# Patient Record
Sex: Female | Born: 1935 | Race: White | Hispanic: No | Marital: Married | Smoking: Never smoker
Health system: Southern US, Community
[De-identification: ages and names within clinical notes are randomized; demographics above are authoritative.]

## PROBLEM LIST (undated history)

## (undated) DIAGNOSIS — E785 Hyperlipidemia, unspecified: Secondary | ICD-10-CM

## (undated) DIAGNOSIS — I10 Essential (primary) hypertension: Secondary | ICD-10-CM

## (undated) DIAGNOSIS — K219 Gastro-esophageal reflux disease without esophagitis: Secondary | ICD-10-CM

## (undated) HISTORY — DX: Hyperlipidemia, unspecified: E78.5

## (undated) HISTORY — DX: Essential (primary) hypertension: I10

---

## 1997-09-07 ENCOUNTER — Other Ambulatory Visit: Admission: RE | Admit: 1997-09-07 | Discharge: 1997-09-07 | Payer: Self-pay | Admitting: *Deleted

## 1998-10-06 ENCOUNTER — Other Ambulatory Visit: Admission: RE | Admit: 1998-10-06 | Discharge: 1998-10-06 | Payer: Self-pay | Admitting: *Deleted

## 1999-10-24 ENCOUNTER — Other Ambulatory Visit: Admission: RE | Admit: 1999-10-24 | Discharge: 1999-10-24 | Payer: Self-pay | Admitting: *Deleted

## 2000-09-27 ENCOUNTER — Other Ambulatory Visit: Admission: RE | Admit: 2000-09-27 | Discharge: 2000-09-27 | Payer: Self-pay | Admitting: *Deleted

## 2001-10-02 ENCOUNTER — Ambulatory Visit (HOSPITAL_COMMUNITY): Admission: RE | Admit: 2001-10-02 | Discharge: 2001-10-02 | Payer: Self-pay | Admitting: Family Medicine

## 2002-07-14 ENCOUNTER — Other Ambulatory Visit: Admission: RE | Admit: 2002-07-14 | Discharge: 2002-07-14 | Payer: Self-pay | Admitting: Gynecology

## 2003-12-01 ENCOUNTER — Encounter: Admission: RE | Admit: 2003-12-01 | Discharge: 2003-12-01 | Payer: Self-pay | Admitting: Family Medicine

## 2004-08-08 ENCOUNTER — Other Ambulatory Visit: Admission: RE | Admit: 2004-08-08 | Discharge: 2004-08-08 | Payer: Self-pay | Admitting: Gynecology

## 2004-08-21 IMAGING — US US ABDOMEN COMPLETE
1 series · 14 of 25 positions shown · non-contrast
Comparison: none

CLINICAL DATA: Abdomen pain, particularly right upper quadrant.
ULTRASOUND OF THE ABDOMEN 
Scans over the upper abdomen were performed.  The gallbladder is moderately well seen and no gallstones are noted.  The liver has a normal echogenic pattern.  The common bile duct measures 2.2 mm proximally but does measure 8.2 mm near the pancreatic head.  Correlation with liver function tests is recommended.  The IVC is grossly normal.  The pancreas appears normal.  Evaluate of the spleen is limited by bowel gas.  No hydronephrosis is seen.  The right kidney measures 11.1 cm sagittally with the left kidney measuring 11.2 cm.  The abdominal aorta is normal in caliber.
IMPRESSION
1.  Slightly prominent distal common bile duct near the pancreatic head.  No definite pancreatic head mass or stone is seen.  Correlate with LFTs.
2.  No hydronephrosis.

[Series 1: unknown · 0.27mm/px · 14 of 71 slices shown]
[im 1/71]
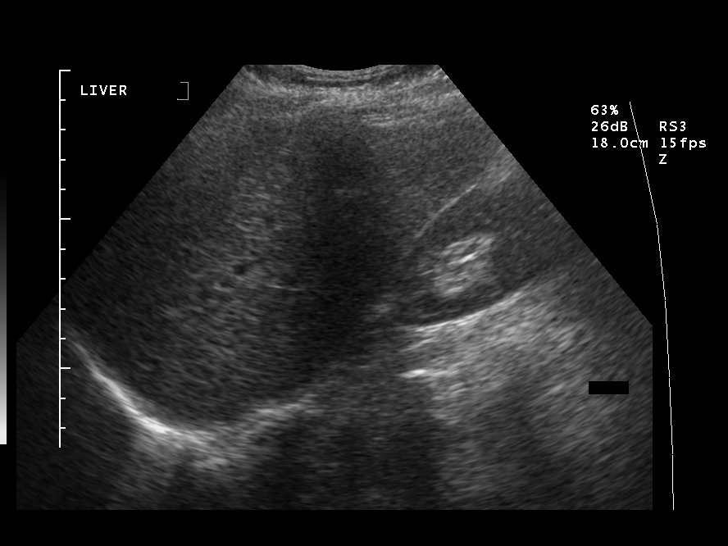
[im 6/71]
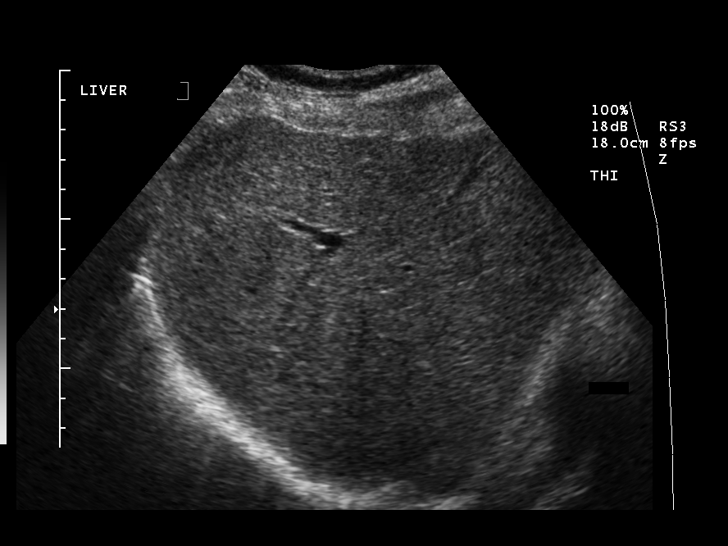
[im 12/71]
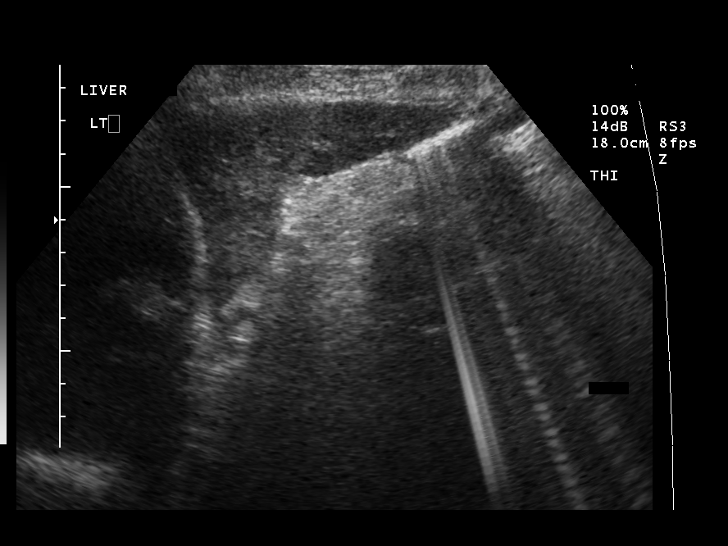
[im 18/71]
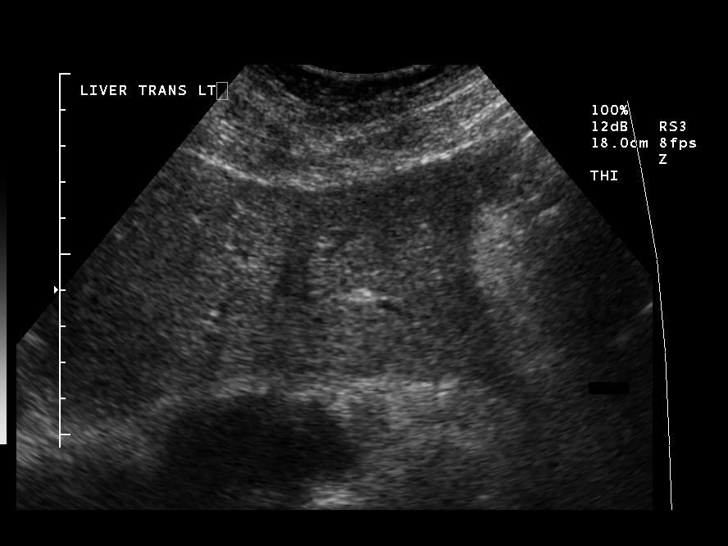
[im 24/71]
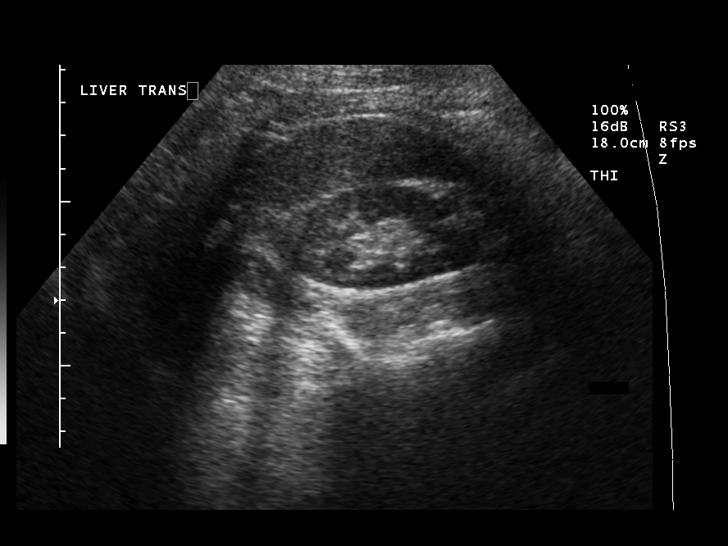
[im 27/71]
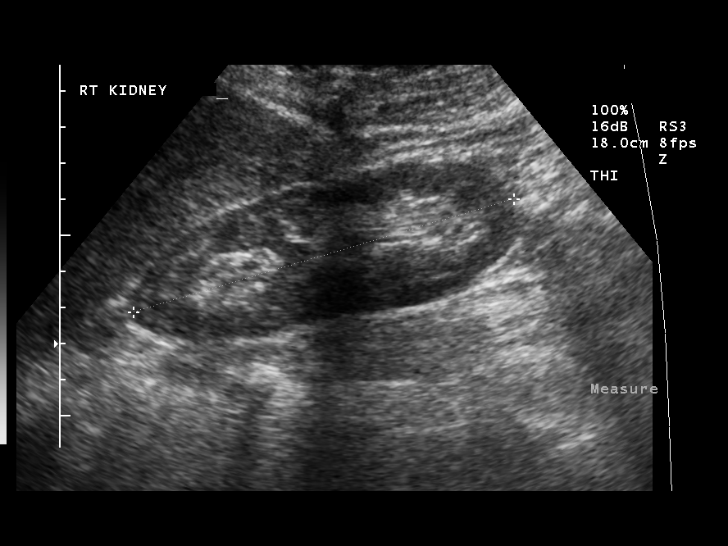
[im 33/71]
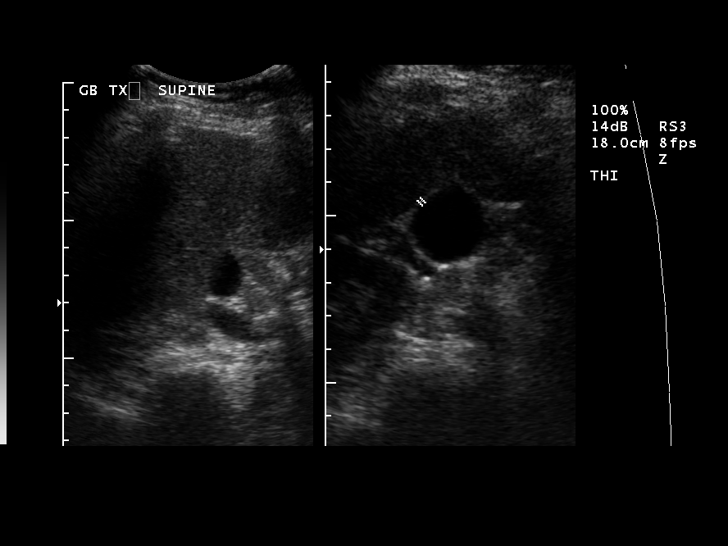
[im 38/71]
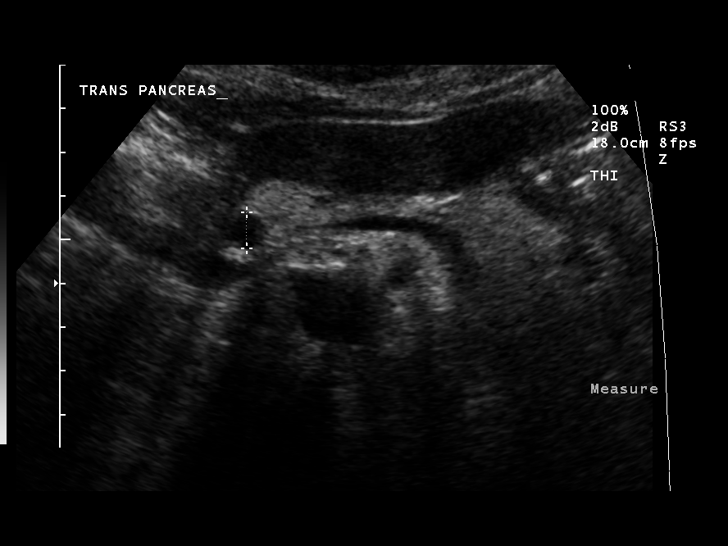
[im 44/71]
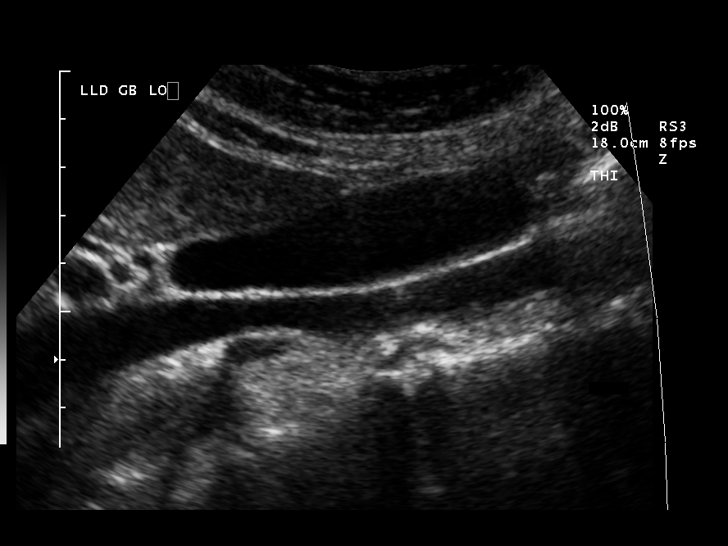
[im 47/71]
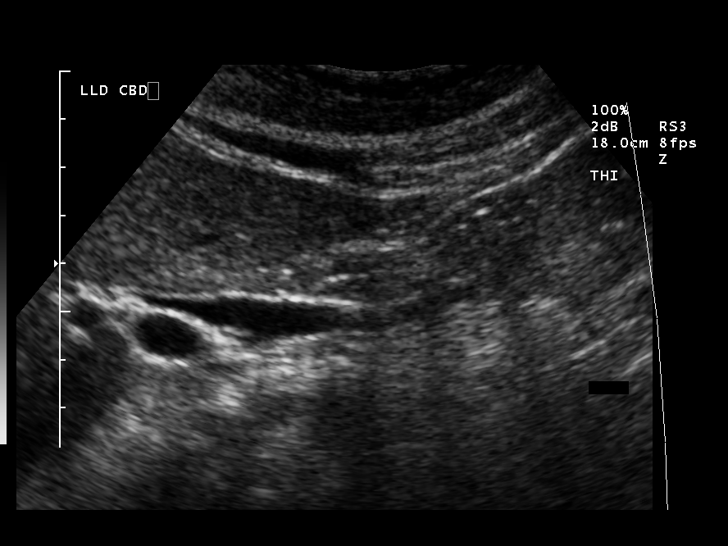
[im 53/71]
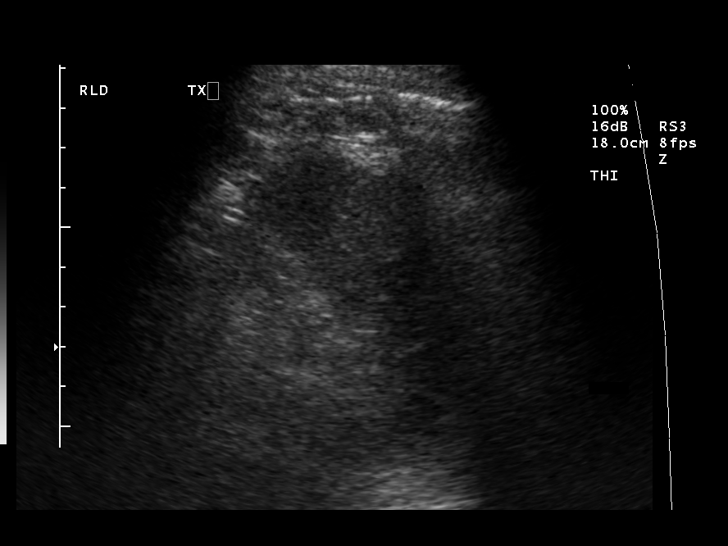
[im 59/71]
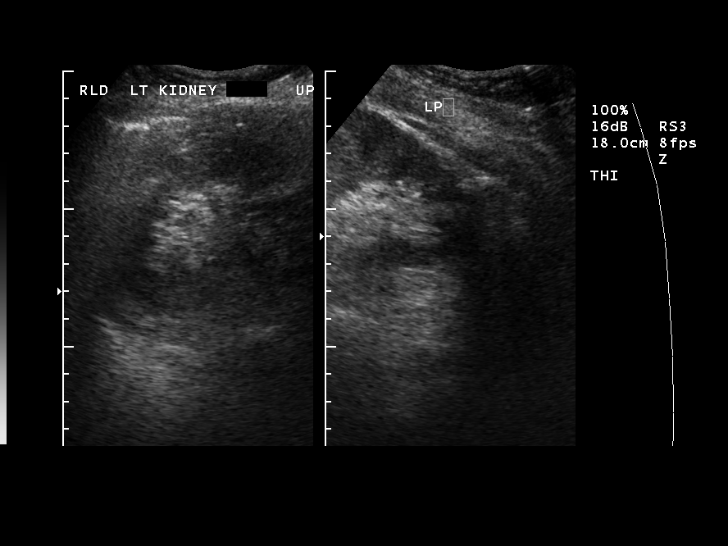
[im 65/71]
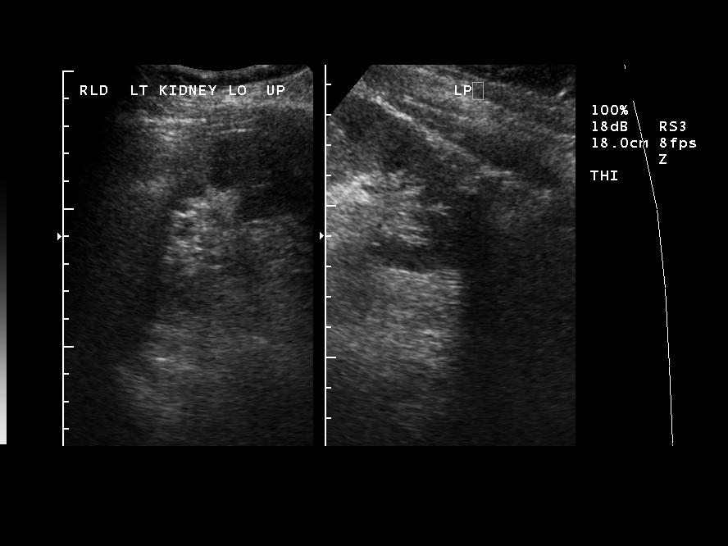
[im 71/71]
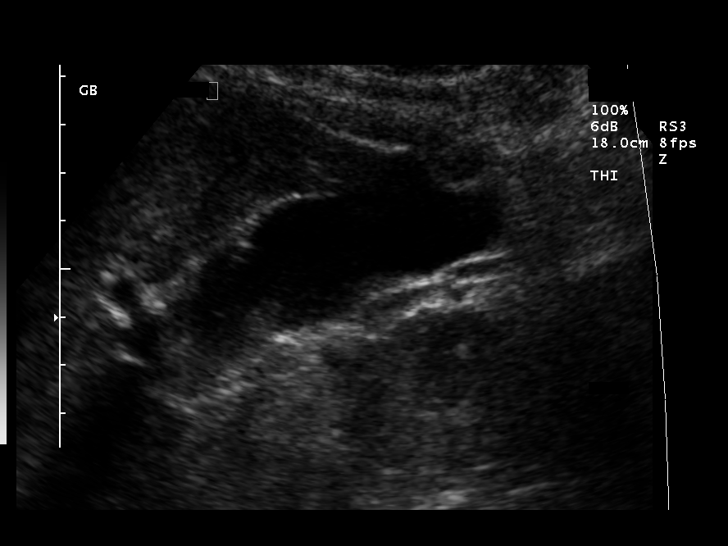

[14 of 25 positions shown; findings below may reference images not displayed]

## 2005-05-05 ENCOUNTER — Ambulatory Visit (HOSPITAL_COMMUNITY): Admission: RE | Admit: 2005-05-05 | Discharge: 2005-05-05 | Payer: Self-pay | Admitting: Family Medicine

## 2006-09-10 ENCOUNTER — Other Ambulatory Visit: Admission: RE | Admit: 2006-09-10 | Discharge: 2006-09-10 | Payer: Self-pay | Admitting: Gynecology

## 2007-03-04 ENCOUNTER — Other Ambulatory Visit: Admission: RE | Admit: 2007-03-04 | Discharge: 2007-03-04 | Payer: Self-pay | Admitting: Gynecology

## 2008-10-27 ENCOUNTER — Encounter (INDEPENDENT_AMBULATORY_CARE_PROVIDER_SITE_OTHER): Payer: Self-pay | Admitting: *Deleted

## 2010-11-27 HISTORY — PX: CATARACT EXTRACTION, BILATERAL: SHX1313

## 2011-03-21 ENCOUNTER — Encounter: Payer: Self-pay | Admitting: Internal Medicine

## 2011-04-24 ENCOUNTER — Ambulatory Visit (AMBULATORY_SURGERY_CENTER): Payer: Medicare Other | Admitting: *Deleted

## 2011-04-24 VITALS — Ht 63.5 in | Wt 137.2 lb

## 2011-04-24 DIAGNOSIS — Z1211 Encounter for screening for malignant neoplasm of colon: Secondary | ICD-10-CM

## 2011-04-24 MED ORDER — PEG-KCL-NACL-NASULF-NA ASC-C 100 G PO SOLR: ORAL | Status: DC

## 2011-05-05 ENCOUNTER — Ambulatory Visit (AMBULATORY_SURGERY_CENTER): Payer: Medicare Other | Admitting: Internal Medicine

## 2011-05-05 ENCOUNTER — Encounter: Payer: Self-pay | Admitting: Internal Medicine

## 2011-05-05 VITALS — BP 140/98 | HR 73 | Temp 98.5°F | Resp 15 | Ht 63.0 in | Wt 137.0 lb

## 2011-05-05 DIAGNOSIS — Z86010 Personal history of colonic polyps: Secondary | ICD-10-CM

## 2011-05-05 DIAGNOSIS — Z1211 Encounter for screening for malignant neoplasm of colon: Secondary | ICD-10-CM

## 2011-05-05 MED ORDER — SODIUM CHLORIDE 0.9 % IV SOLN: 500.0000 mL | INTRAVENOUS | Status: DC

## 2011-05-05 NOTE — Progress Notes (Signed)
Patient did not experience any of the following events: a burn prior to discharge; a fall within the facility; wrong site/side/patient/procedure/implant event; or a hospital transfer or hospital admission upon discharge from the facility. (G8907) Patient did not have preoperative order for IV antibiotic SSI prophylaxis. (G8918)  

## 2011-05-05 NOTE — Op Note (Signed)
[facility] [address] [place], [place]  [address]  COLONOSCOPY PROCEDURE REPORT  PATIENT:  Mckenzie, Warner  MR#:  [id number] BIRTHDATE:  1930/10/04, 80 yrs. old  GENDER:  female ENDOSCOPIST:  Hedwig Morton. Juanda Chance, MD REF. BY:  Theressa Millard, M.D. PROCEDURE DATE:  05/05/2011 PROCEDURE:  Colonoscopy 19147 ASA CLASS:  Class II INDICATIONS:  history of hyperplastic polyps hyperplastic polyp 2005 MEDICATIONS:   These medications were titrated to patient response per physician's verbal order, Versed 7 mg, Fentanyl 75 mcg  DESCRIPTION OF PROCEDURE:   After the risks and benefits and of the procedure were explained, informed consent was obtained. Digital rectal exam was performed and revealed no rectal masses. The LB [id number]-[id number] endoscope was introduced through the anus and advanced to the cecum, which was identified by both the appendix and ileocecal valve.  The quality of the prep was good, using MoviPrep.  The instrument was then slowly withdrawn as the colon was fully examined. <<PROCEDUREIMAGES>>  FINDINGS:  Moderate diverticulosis was found in the sigmoid colon (see image1, image7, image8, and image6).  This was otherwise a normal examination of the colon (see image9, image5, image4, image3, and image2).   Retroflexed views in the rectum revealed no abnormalities.    The scope was then withdrawn from the patient and the procedure completed.  COMPLICATIONS:  None ENDOSCOPIC IMPRESSION: 1) Moderate diverticulosis in the sigmoid colon 2) Otherwise normal examination RECOMMENDATIONS: 1) High fiber diet.  REPEAT EXAM:  In 0 year(s) for.  no recall due to age  ______________________________ Hedwig Morton. Juanda Chance, MD  CC:  n. eSIGNED:   Hedwig Morton. Brodie at 05/05/2011 11:12 AM  Kathlene November, [phone]

## 2011-05-08 ENCOUNTER — Telehealth: Payer: Self-pay | Admitting: *Deleted

## 2011-05-08 NOTE — Telephone Encounter (Signed)

## 2011-08-31 DIAGNOSIS — I10 Essential (primary) hypertension: Secondary | ICD-10-CM | POA: Diagnosis not present

## 2011-08-31 DIAGNOSIS — Z1331 Encounter for screening for depression: Secondary | ICD-10-CM | POA: Diagnosis not present

## 2011-08-31 DIAGNOSIS — E7800 Pure hypercholesterolemia, unspecified: Secondary | ICD-10-CM | POA: Diagnosis not present

## 2011-11-13 DIAGNOSIS — M949 Disorder of cartilage, unspecified: Secondary | ICD-10-CM | POA: Diagnosis not present

## 2011-11-13 DIAGNOSIS — G4700 Insomnia, unspecified: Secondary | ICD-10-CM | POA: Diagnosis not present

## 2011-11-13 DIAGNOSIS — M899 Disorder of bone, unspecified: Secondary | ICD-10-CM | POA: Diagnosis not present

## 2011-11-13 DIAGNOSIS — N8184 Pelvic muscle wasting: Secondary | ICD-10-CM | POA: Diagnosis not present

## 2011-11-22 DIAGNOSIS — L819 Disorder of pigmentation, unspecified: Secondary | ICD-10-CM | POA: Diagnosis not present

## 2011-11-22 DIAGNOSIS — B079 Viral wart, unspecified: Secondary | ICD-10-CM | POA: Diagnosis not present

## 2011-11-22 DIAGNOSIS — L821 Other seborrheic keratosis: Secondary | ICD-10-CM | POA: Diagnosis not present

## 2012-03-05 DIAGNOSIS — Z23 Encounter for immunization: Secondary | ICD-10-CM | POA: Diagnosis not present

## 2012-05-03 DIAGNOSIS — Z1231 Encounter for screening mammogram for malignant neoplasm of breast: Secondary | ICD-10-CM | POA: Diagnosis not present

## 2012-05-20 DIAGNOSIS — E7800 Pure hypercholesterolemia, unspecified: Secondary | ICD-10-CM | POA: Diagnosis not present

## 2012-05-20 DIAGNOSIS — I10 Essential (primary) hypertension: Secondary | ICD-10-CM | POA: Diagnosis not present

## 2012-08-22 DIAGNOSIS — L570 Actinic keratosis: Secondary | ICD-10-CM | POA: Diagnosis not present

## 2012-08-22 DIAGNOSIS — L821 Other seborrheic keratosis: Secondary | ICD-10-CM | POA: Diagnosis not present

## 2012-08-22 DIAGNOSIS — D485 Neoplasm of uncertain behavior of skin: Secondary | ICD-10-CM | POA: Diagnosis not present

## 2012-11-20 DIAGNOSIS — Z1331 Encounter for screening for depression: Secondary | ICD-10-CM | POA: Diagnosis not present

## 2012-11-20 DIAGNOSIS — I10 Essential (primary) hypertension: Secondary | ICD-10-CM | POA: Diagnosis not present

## 2012-11-20 DIAGNOSIS — G479 Sleep disorder, unspecified: Secondary | ICD-10-CM | POA: Diagnosis not present

## 2013-03-17 DIAGNOSIS — Z23 Encounter for immunization: Secondary | ICD-10-CM | POA: Diagnosis not present

## 2013-06-02 DIAGNOSIS — Z1231 Encounter for screening mammogram for malignant neoplasm of breast: Secondary | ICD-10-CM | POA: Diagnosis not present

## 2014-01-15 DIAGNOSIS — IMO0002 Reserved for concepts with insufficient information to code with codable children: Secondary | ICD-10-CM | POA: Diagnosis not present

## 2014-01-15 DIAGNOSIS — E7800 Pure hypercholesterolemia, unspecified: Secondary | ICD-10-CM | POA: Diagnosis not present

## 2014-01-15 DIAGNOSIS — Z1331 Encounter for screening for depression: Secondary | ICD-10-CM | POA: Diagnosis not present

## 2014-01-15 DIAGNOSIS — I10 Essential (primary) hypertension: Secondary | ICD-10-CM | POA: Diagnosis not present

## 2014-01-15 DIAGNOSIS — Z23 Encounter for immunization: Secondary | ICD-10-CM | POA: Diagnosis not present

## 2014-01-19 DIAGNOSIS — I10 Essential (primary) hypertension: Secondary | ICD-10-CM | POA: Diagnosis not present

## 2014-01-19 DIAGNOSIS — E7800 Pure hypercholesterolemia, unspecified: Secondary | ICD-10-CM | POA: Diagnosis not present

## 2014-02-13 DIAGNOSIS — Z23 Encounter for immunization: Secondary | ICD-10-CM | POA: Diagnosis not present

## 2014-07-06 DIAGNOSIS — Z1231 Encounter for screening mammogram for malignant neoplasm of breast: Secondary | ICD-10-CM | POA: Diagnosis not present

## 2014-10-13 ENCOUNTER — Encounter: Payer: Self-pay | Admitting: Internal Medicine

## 2015-02-08 DIAGNOSIS — Z1389 Encounter for screening for other disorder: Secondary | ICD-10-CM | POA: Diagnosis not present

## 2015-02-08 DIAGNOSIS — E780 Pure hypercholesterolemia: Secondary | ICD-10-CM | POA: Diagnosis not present

## 2015-02-08 DIAGNOSIS — I10 Essential (primary) hypertension: Secondary | ICD-10-CM | POA: Diagnosis not present

## 2015-02-08 DIAGNOSIS — M79604 Pain in right leg: Secondary | ICD-10-CM | POA: Diagnosis not present

## 2015-02-08 DIAGNOSIS — Z0001 Encounter for general adult medical examination with abnormal findings: Secondary | ICD-10-CM | POA: Diagnosis not present

## 2015-03-22 DIAGNOSIS — J040 Acute laryngitis: Secondary | ICD-10-CM | POA: Diagnosis not present

## 2015-03-22 DIAGNOSIS — J029 Acute pharyngitis, unspecified: Secondary | ICD-10-CM | POA: Diagnosis not present

## 2015-03-22 DIAGNOSIS — R05 Cough: Secondary | ICD-10-CM | POA: Diagnosis not present

## 2015-07-13 DIAGNOSIS — Z961 Presence of intraocular lens: Secondary | ICD-10-CM | POA: Diagnosis not present

## 2015-07-13 DIAGNOSIS — H26491 Other secondary cataract, right eye: Secondary | ICD-10-CM | POA: Diagnosis not present

## 2015-07-13 DIAGNOSIS — H26492 Other secondary cataract, left eye: Secondary | ICD-10-CM | POA: Diagnosis not present

## 2015-08-16 DIAGNOSIS — Z1231 Encounter for screening mammogram for malignant neoplasm of breast: Secondary | ICD-10-CM | POA: Diagnosis not present

## 2016-01-25 DIAGNOSIS — H02839 Dermatochalasis of unspecified eye, unspecified eyelid: Secondary | ICD-10-CM | POA: Diagnosis not present

## 2016-01-25 DIAGNOSIS — Z961 Presence of intraocular lens: Secondary | ICD-10-CM | POA: Diagnosis not present

## 2016-01-25 DIAGNOSIS — H26492 Other secondary cataract, left eye: Secondary | ICD-10-CM | POA: Diagnosis not present

## 2016-02-11 DIAGNOSIS — E7800 Pure hypercholesterolemia, unspecified: Secondary | ICD-10-CM | POA: Diagnosis not present

## 2016-02-11 DIAGNOSIS — I10 Essential (primary) hypertension: Secondary | ICD-10-CM | POA: Diagnosis not present

## 2016-02-11 DIAGNOSIS — Z1389 Encounter for screening for other disorder: Secondary | ICD-10-CM | POA: Diagnosis not present

## 2016-02-11 DIAGNOSIS — R7309 Other abnormal glucose: Secondary | ICD-10-CM | POA: Diagnosis not present

## 2016-02-11 DIAGNOSIS — M8580 Other specified disorders of bone density and structure, unspecified site: Secondary | ICD-10-CM | POA: Diagnosis not present

## 2016-02-11 DIAGNOSIS — Z0000 Encounter for general adult medical examination without abnormal findings: Secondary | ICD-10-CM | POA: Diagnosis not present

## 2016-02-11 DIAGNOSIS — Z23 Encounter for immunization: Secondary | ICD-10-CM | POA: Diagnosis not present

## 2016-02-11 DIAGNOSIS — N952 Postmenopausal atrophic vaginitis: Secondary | ICD-10-CM | POA: Diagnosis not present

## 2016-03-13 DIAGNOSIS — Z780 Asymptomatic menopausal state: Secondary | ICD-10-CM | POA: Diagnosis not present

## 2016-03-13 DIAGNOSIS — E7800 Pure hypercholesterolemia, unspecified: Secondary | ICD-10-CM | POA: Diagnosis not present

## 2016-03-13 DIAGNOSIS — N952 Postmenopausal atrophic vaginitis: Secondary | ICD-10-CM | POA: Diagnosis not present

## 2016-03-13 DIAGNOSIS — I10 Essential (primary) hypertension: Secondary | ICD-10-CM | POA: Diagnosis not present

## 2016-03-13 DIAGNOSIS — M810 Age-related osteoporosis without current pathological fracture: Secondary | ICD-10-CM | POA: Diagnosis not present

## 2016-07-31 DIAGNOSIS — I10 Essential (primary) hypertension: Secondary | ICD-10-CM | POA: Diagnosis not present

## 2016-07-31 DIAGNOSIS — E7800 Pure hypercholesterolemia, unspecified: Secondary | ICD-10-CM | POA: Diagnosis not present

## 2016-07-31 DIAGNOSIS — M810 Age-related osteoporosis without current pathological fracture: Secondary | ICD-10-CM | POA: Diagnosis not present

## 2016-07-31 DIAGNOSIS — N952 Postmenopausal atrophic vaginitis: Secondary | ICD-10-CM | POA: Diagnosis not present

## 2016-07-31 DIAGNOSIS — G4700 Insomnia, unspecified: Secondary | ICD-10-CM | POA: Diagnosis not present

## 2017-01-01 DIAGNOSIS — H26492 Other secondary cataract, left eye: Secondary | ICD-10-CM | POA: Diagnosis not present

## 2017-01-01 DIAGNOSIS — H02839 Dermatochalasis of unspecified eye, unspecified eyelid: Secondary | ICD-10-CM | POA: Diagnosis not present

## 2017-01-01 DIAGNOSIS — Z961 Presence of intraocular lens: Secondary | ICD-10-CM | POA: Diagnosis not present

## 2017-01-01 DIAGNOSIS — H04123 Dry eye syndrome of bilateral lacrimal glands: Secondary | ICD-10-CM | POA: Diagnosis not present

## 2017-02-09 DIAGNOSIS — Z23 Encounter for immunization: Secondary | ICD-10-CM | POA: Diagnosis not present

## 2017-02-14 DIAGNOSIS — N952 Postmenopausal atrophic vaginitis: Secondary | ICD-10-CM | POA: Diagnosis not present

## 2017-02-14 DIAGNOSIS — Z1389 Encounter for screening for other disorder: Secondary | ICD-10-CM | POA: Diagnosis not present

## 2017-02-14 DIAGNOSIS — M810 Age-related osteoporosis without current pathological fracture: Secondary | ICD-10-CM | POA: Diagnosis not present

## 2017-02-14 DIAGNOSIS — I10 Essential (primary) hypertension: Secondary | ICD-10-CM | POA: Diagnosis not present

## 2017-02-14 DIAGNOSIS — E7800 Pure hypercholesterolemia, unspecified: Secondary | ICD-10-CM | POA: Diagnosis not present

## 2017-02-14 DIAGNOSIS — Z0000 Encounter for general adult medical examination without abnormal findings: Secondary | ICD-10-CM | POA: Diagnosis not present

## 2017-02-14 DIAGNOSIS — R7303 Prediabetes: Secondary | ICD-10-CM | POA: Diagnosis not present

## 2017-02-14 DIAGNOSIS — G4700 Insomnia, unspecified: Secondary | ICD-10-CM | POA: Diagnosis not present

## 2017-08-20 DIAGNOSIS — E7800 Pure hypercholesterolemia, unspecified: Secondary | ICD-10-CM | POA: Diagnosis not present

## 2017-08-20 DIAGNOSIS — I10 Essential (primary) hypertension: Secondary | ICD-10-CM | POA: Diagnosis not present

## 2017-08-20 DIAGNOSIS — N8110 Cystocele, unspecified: Secondary | ICD-10-CM | POA: Diagnosis not present

## 2017-08-20 DIAGNOSIS — M810 Age-related osteoporosis without current pathological fracture: Secondary | ICD-10-CM | POA: Diagnosis not present

## 2017-08-27 DIAGNOSIS — N763 Subacute and chronic vulvitis: Secondary | ICD-10-CM | POA: Diagnosis not present

## 2017-09-25 DIAGNOSIS — N952 Postmenopausal atrophic vaginitis: Secondary | ICD-10-CM | POA: Diagnosis not present

## 2017-09-25 DIAGNOSIS — N763 Subacute and chronic vulvitis: Secondary | ICD-10-CM | POA: Diagnosis not present

## 2018-01-07 DIAGNOSIS — H02886 Meibomian gland dysfunction of left eye, unspecified eyelid: Secondary | ICD-10-CM | POA: Diagnosis not present

## 2018-01-07 DIAGNOSIS — H26492 Other secondary cataract, left eye: Secondary | ICD-10-CM | POA: Diagnosis not present

## 2018-01-07 DIAGNOSIS — H02883 Meibomian gland dysfunction of right eye, unspecified eyelid: Secondary | ICD-10-CM | POA: Diagnosis not present

## 2018-02-25 DIAGNOSIS — Z1389 Encounter for screening for other disorder: Secondary | ICD-10-CM | POA: Diagnosis not present

## 2018-02-25 DIAGNOSIS — I10 Essential (primary) hypertension: Secondary | ICD-10-CM | POA: Diagnosis not present

## 2018-02-25 DIAGNOSIS — M810 Age-related osteoporosis without current pathological fracture: Secondary | ICD-10-CM | POA: Diagnosis not present

## 2018-02-25 DIAGNOSIS — E7800 Pure hypercholesterolemia, unspecified: Secondary | ICD-10-CM | POA: Diagnosis not present

## 2018-02-25 DIAGNOSIS — Z0000 Encounter for general adult medical examination without abnormal findings: Secondary | ICD-10-CM | POA: Diagnosis not present

## 2018-02-25 DIAGNOSIS — N952 Postmenopausal atrophic vaginitis: Secondary | ICD-10-CM | POA: Diagnosis not present

## 2018-02-25 DIAGNOSIS — Z23 Encounter for immunization: Secondary | ICD-10-CM | POA: Diagnosis not present

## 2018-02-25 DIAGNOSIS — R300 Dysuria: Secondary | ICD-10-CM | POA: Diagnosis not present

## 2018-02-28 DIAGNOSIS — N763 Subacute and chronic vulvitis: Secondary | ICD-10-CM | POA: Diagnosis not present

## 2018-02-28 DIAGNOSIS — N711 Chronic inflammatory disease of uterus: Secondary | ICD-10-CM | POA: Diagnosis not present

## 2018-02-28 DIAGNOSIS — N952 Postmenopausal atrophic vaginitis: Secondary | ICD-10-CM | POA: Diagnosis not present

## 2018-03-28 DIAGNOSIS — N952 Postmenopausal atrophic vaginitis: Secondary | ICD-10-CM | POA: Diagnosis not present

## 2018-03-28 DIAGNOSIS — N763 Subacute and chronic vulvitis: Secondary | ICD-10-CM | POA: Diagnosis not present

## 2018-07-29 DIAGNOSIS — G5602 Carpal tunnel syndrome, left upper limb: Secondary | ICD-10-CM | POA: Diagnosis not present

## 2018-07-31 DIAGNOSIS — G5602 Carpal tunnel syndrome, left upper limb: Secondary | ICD-10-CM | POA: Diagnosis not present

## 2018-07-31 DIAGNOSIS — G5612 Other lesions of median nerve, left upper limb: Secondary | ICD-10-CM | POA: Diagnosis not present

## 2018-08-01 ENCOUNTER — Other Ambulatory Visit: Payer: Self-pay

## 2018-08-01 ENCOUNTER — Encounter (HOSPITAL_BASED_OUTPATIENT_CLINIC_OR_DEPARTMENT_OTHER): Payer: Self-pay

## 2018-08-01 ENCOUNTER — Other Ambulatory Visit: Payer: Self-pay | Admitting: Orthopedic Surgery

## 2018-08-05 ENCOUNTER — Encounter (HOSPITAL_BASED_OUTPATIENT_CLINIC_OR_DEPARTMENT_OTHER)
Admission: RE | Admit: 2018-08-05 | Discharge: 2018-08-05 | Disposition: A | Discharge status: Home / Self Care | Payer: Medicare Other | Source: Ambulatory Visit | Attending: Orthopedic Surgery | Admitting: Orthopedic Surgery

## 2018-08-05 DIAGNOSIS — Z79899 Other long term (current) drug therapy: Secondary | ICD-10-CM | POA: Diagnosis not present

## 2018-08-05 DIAGNOSIS — K219 Gastro-esophageal reflux disease without esophagitis: Secondary | ICD-10-CM | POA: Diagnosis not present

## 2018-08-05 DIAGNOSIS — Z9841 Cataract extraction status, right eye: Secondary | ICD-10-CM | POA: Diagnosis not present

## 2018-08-05 DIAGNOSIS — E785 Hyperlipidemia, unspecified: Secondary | ICD-10-CM | POA: Diagnosis not present

## 2018-08-05 DIAGNOSIS — G5602 Carpal tunnel syndrome, left upper limb: Secondary | ICD-10-CM | POA: Diagnosis not present

## 2018-08-05 DIAGNOSIS — Z9842 Cataract extraction status, left eye: Secondary | ICD-10-CM | POA: Diagnosis not present

## 2018-08-05 DIAGNOSIS — I10 Essential (primary) hypertension: Secondary | ICD-10-CM | POA: Diagnosis not present

## 2018-08-08 ENCOUNTER — Encounter (HOSPITAL_BASED_OUTPATIENT_CLINIC_OR_DEPARTMENT_OTHER): Payer: Self-pay | Admitting: *Deleted

## 2018-08-08 ENCOUNTER — Ambulatory Visit (HOSPITAL_BASED_OUTPATIENT_CLINIC_OR_DEPARTMENT_OTHER): Payer: Medicare Other | Admitting: Anesthesiology

## 2018-08-08 ENCOUNTER — Other Ambulatory Visit: Payer: Self-pay

## 2018-08-08 ENCOUNTER — Ambulatory Visit (HOSPITAL_BASED_OUTPATIENT_CLINIC_OR_DEPARTMENT_OTHER)
Admission: RE | Admit: 2018-08-08 | Discharge: 2018-08-08 | Disposition: A | Discharge status: Home / Self Care | Payer: Medicare Other | Attending: Orthopedic Surgery | Admitting: Orthopedic Surgery

## 2018-08-08 ENCOUNTER — Encounter (HOSPITAL_BASED_OUTPATIENT_CLINIC_OR_DEPARTMENT_OTHER)
Admission: RE | Disposition: A | Discharge status: Home / Self Care | Payer: Self-pay | Source: Home / Self Care | Attending: Orthopedic Surgery

## 2018-08-08 DIAGNOSIS — E785 Hyperlipidemia, unspecified: Secondary | ICD-10-CM | POA: Insufficient documentation

## 2018-08-08 DIAGNOSIS — K219 Gastro-esophageal reflux disease without esophagitis: Secondary | ICD-10-CM | POA: Insufficient documentation

## 2018-08-08 DIAGNOSIS — G5602 Carpal tunnel syndrome, left upper limb: Secondary | ICD-10-CM | POA: Insufficient documentation

## 2018-08-08 DIAGNOSIS — I10 Essential (primary) hypertension: Secondary | ICD-10-CM | POA: Diagnosis not present

## 2018-08-08 DIAGNOSIS — Z9842 Cataract extraction status, left eye: Secondary | ICD-10-CM | POA: Insufficient documentation

## 2018-08-08 DIAGNOSIS — Z79899 Other long term (current) drug therapy: Secondary | ICD-10-CM | POA: Insufficient documentation

## 2018-08-08 DIAGNOSIS — Z9841 Cataract extraction status, right eye: Secondary | ICD-10-CM | POA: Insufficient documentation

## 2018-08-08 HISTORY — PX: CARPAL TUNNEL RELEASE: SHX101

## 2018-08-08 HISTORY — DX: Gastro-esophageal reflux disease without esophagitis: K21.9

## 2018-08-08 SURGERY — CARPAL TUNNEL RELEASE
Anesthesia: Regional | Laterality: Left

## 2018-08-08 MED ORDER — CEFAZOLIN SODIUM-DEXTROSE 2-4 GM/100ML-% IV SOLN
INTRAVENOUS | Status: AC
  Filled 2018-08-08: qty 100

## 2018-08-08 MED ORDER — ONDANSETRON HCL 4 MG/2ML IJ SOLN
INTRAMUSCULAR | Status: AC
  Filled 2018-08-08: qty 2

## 2018-08-08 MED ORDER — FENTANYL CITRATE (PF) 100 MCG/2ML IJ SOLN
50.0000 ug | INTRAMUSCULAR | Status: DC | PRN
  Administered 2018-08-08 (×2): 50 ug via INTRAVENOUS

## 2018-08-08 MED ORDER — FENTANYL CITRATE (PF) 100 MCG/2ML IJ SOLN
INTRAMUSCULAR | Status: AC
  Filled 2018-08-08: qty 2

## 2018-08-08 MED ORDER — OXYCODONE HCL 5 MG PO TABS: 5.0000 mg | ORAL_TABLET | Freq: Once | ORAL | Status: DC | PRN

## 2018-08-08 MED ORDER — ONDANSETRON HCL 4 MG/2ML IJ SOLN
INTRAMUSCULAR | Status: DC | PRN
  Administered 2018-08-08: 4 mg via INTRAVENOUS

## 2018-08-08 MED ORDER — FENTANYL CITRATE (PF) 100 MCG/2ML IJ SOLN: 25.0000 ug | INTRAMUSCULAR | Status: DC | PRN

## 2018-08-08 MED ORDER — CHLORHEXIDINE GLUCONATE 4 % EX LIQD: 60.0000 mL | Freq: Once | CUTANEOUS | Status: DC

## 2018-08-08 MED ORDER — LACTATED RINGERS IV SOLN
INTRAVENOUS | Status: DC
  Administered 2018-08-08: 09:00:00 via INTRAVENOUS

## 2018-08-08 MED ORDER — CEFAZOLIN SODIUM-DEXTROSE 2-4 GM/100ML-% IV SOLN
2.0000 g | INTRAVENOUS | Status: AC
  Administered 2018-08-08: 2 g via INTRAVENOUS

## 2018-08-08 MED ORDER — OXYCODONE HCL 5 MG/5ML PO SOLN: 5.0000 mg | Freq: Once | ORAL | Status: DC | PRN

## 2018-08-08 MED ORDER — BUPIVACAINE HCL (PF) 0.25 % IJ SOLN
INTRAMUSCULAR | Status: DC | PRN
  Administered 2018-08-08: 10 mL

## 2018-08-08 MED ORDER — ONDANSETRON HCL 4 MG/2ML IJ SOLN: 4.0000 mg | Freq: Once | INTRAMUSCULAR | Status: DC | PRN

## 2018-08-08 MED ORDER — LIDOCAINE HCL (CARDIAC) PF 100 MG/5ML IV SOSY
PREFILLED_SYRINGE | INTRAVENOUS | Status: DC | PRN
  Administered 2018-08-08: 20 mg via INTRAVENOUS

## 2018-08-08 MED ORDER — SCOPOLAMINE 1 MG/3DAYS TD PT72: 1.0000 | MEDICATED_PATCH | Freq: Once | TRANSDERMAL | Status: DC | PRN

## 2018-08-08 MED ORDER — PROPOFOL 10 MG/ML IV BOLUS
INTRAVENOUS | Status: DC | PRN
  Administered 2018-08-08: 20 mg via INTRAVENOUS

## 2018-08-08 MED ORDER — MIDAZOLAM HCL 2 MG/2ML IJ SOLN: 1.0000 mg | INTRAMUSCULAR | Status: DC | PRN

## 2018-08-08 MED ORDER — LIDOCAINE HCL (PF) 0.5 % IJ SOLN
INTRAMUSCULAR | Status: DC | PRN
  Administered 2018-08-08: 35 mL via INTRAVENOUS

## 2018-08-08 MED ORDER — HYDROCODONE-ACETAMINOPHEN 5-325 MG PO TABS: ORAL_TABLET | ORAL | 0 refills | Status: AC

## 2018-08-08 MED ORDER — LIDOCAINE 2% (20 MG/ML) 5 ML SYRINGE
INTRAMUSCULAR | Status: AC
  Filled 2018-08-08: qty 5

## 2018-08-08 SURGICAL SUPPLY — 41 items
BANDAGE ACE 3X5.8 VEL STRL LF (GAUZE/BANDAGES/DRESSINGS) ×3 IMPLANT
BLADE SURG 15 STRL LF DISP TIS (BLADE) ×2 IMPLANT
BLADE SURG 15 STRL SS (BLADE) ×6
BNDG CMPR 9X4 STRL LF SNTH (GAUZE/BANDAGES/DRESSINGS) ×1
BNDG COHESIVE 1X5 TAN NS LF (GAUZE/BANDAGES/DRESSINGS) ×3 IMPLANT
BNDG ESMARK 4X9 LF (GAUZE/BANDAGES/DRESSINGS) ×3 IMPLANT
BNDG GAUZE ELAST 4 BULKY (GAUZE/BANDAGES/DRESSINGS) ×3 IMPLANT
CHLORAPREP W/TINT 26 (MISCELLANEOUS) ×3 IMPLANT
CORD BIPOLAR FORCEPS 12FT (ELECTRODE) ×3 IMPLANT
COVER BACK TABLE 60X90IN (DRAPES) ×3 IMPLANT
COVER MAYO STAND STRL (DRAPES) ×3 IMPLANT
COVER WAND RF STERILE (DRAPES) IMPLANT
CUFF TOURN SGL QUICK 18X4 (TOURNIQUET CUFF) ×3 IMPLANT
CUFF TOURNIQUET SINGLE 18IN (TOURNIQUET CUFF) ×3 IMPLANT
DRAPE EXTREMITY T 121X128X90 (DISPOSABLE) ×3 IMPLANT
DRAPE SURG 17X23 STRL (DRAPES) ×3 IMPLANT
DRSG PAD ABDOMINAL 8X10 ST (GAUZE/BANDAGES/DRESSINGS) ×3 IMPLANT
GAUZE SPONGE 4X4 12PLY STRL (GAUZE/BANDAGES/DRESSINGS) ×3 IMPLANT
GAUZE XEROFORM 1X8 LF (GAUZE/BANDAGES/DRESSINGS) ×3 IMPLANT
GLOVE BIO SURGEON STRL SZ7.5 (GLOVE) ×3 IMPLANT
GLOVE BIOGEL PI IND STRL 7.5 (GLOVE) ×1 IMPLANT
GLOVE BIOGEL PI IND STRL 8 (GLOVE) ×1 IMPLANT
GLOVE BIOGEL PI INDICATOR 7.5 (GLOVE) ×2
GLOVE BIOGEL PI INDICATOR 8 (GLOVE) ×2
GLOVE SS BIOGEL STRL SZ 7 (GLOVE) ×1 IMPLANT
GLOVE SUPERSENSE BIOGEL SZ 7 (GLOVE) ×2
GLOVE SURG SS PI 7.5 STRL IVOR (GLOVE) ×3 IMPLANT
GOWN STRL REUS W/ TWL LRG LVL3 (GOWN DISPOSABLE) ×1 IMPLANT
GOWN STRL REUS W/TWL LRG LVL3 (GOWN DISPOSABLE) ×2
GOWN STRL REUS W/TWL XL LVL3 (GOWN DISPOSABLE) ×3 IMPLANT
NEEDLE HYPO 25X1 1.5 SAFETY (NEEDLE) ×3 IMPLANT
NS IRRIG 1000ML POUR BTL (IV SOLUTION) ×3 IMPLANT
PACK BASIN DAY SURGERY FS (CUSTOM PROCEDURE TRAY) ×3 IMPLANT
PADDING CAST ABS 4INX4YD NS (CAST SUPPLIES) ×2
PADDING CAST ABS COTTON 4X4 ST (CAST SUPPLIES) ×1 IMPLANT
STOCKINETTE 4X48 STRL (DRAPES) ×3 IMPLANT
SUT ETHILON 4 0 PS 2 18 (SUTURE) ×3 IMPLANT
SYR BULB 3OZ (MISCELLANEOUS) ×3 IMPLANT
SYR CONTROL 10ML LL (SYRINGE) ×3 IMPLANT
TOWEL GREEN STERILE FF (TOWEL DISPOSABLE) ×6 IMPLANT
UNDERPAD 30X30 (UNDERPADS AND DIAPERS) ×3 IMPLANT

## 2018-08-08 NOTE — Anesthesia Procedure Notes (Signed)
Anesthesia Regional Block: Bier block (IV Regional)   Pre-Anesthetic Checklist: ,, timeout performed, Correct Patient, Correct Site, Correct Laterality, Correct Procedure,, site marked, surgical consent,, at surgeon's request  Laterality: Left     Needles:  Injection technique: Single-shot  Needle Type: Other      Needle Gauge: 20     Additional Needles:   Procedures:,,,,, intact distal pulses, Esmarch exsanguination, single tourniquet utilized,  Narrative:   Performed by: Personally       

## 2018-08-08 NOTE — H&P (Signed)
  Mckenzie Warner is an 85 y.o. female.   Chief Complaint: left carpal tunnel syndrome HPI: 85 yo female with numbness and tingling left hand.  Positive nerve conduction studies. Nocturnal symptoms.  She wishes to have a carpal tunnel release.  Allergies: No Known Allergies  Past Medical History:  Diagnosis Date  . GERD (gastroesophageal reflux disease)    tums as needed  . Hyperlipidemia   . Hypertension     Past Surgical History:  Procedure Laterality Date  . CATARACT EXTRACTION, BILATERAL  11/2010    Family History: Family History  Problem Relation Age of Onset  . Colon cancer Neg Hx   . Esophageal cancer Neg Hx     Social History:   reports that she has never smoked. She has never used smokeless tobacco. She reports current alcohol use. She reports that she does not use drugs.  Medications: Medications Prior to Admission  Medication Sig Dispense Refill  . atorvastatin (LIPITOR) 20 MG tablet Take 20 mg by mouth daily.     . Calcium Citrate-Vitamin D (CITRACAL PETITES/VITAMIN D PO) Take by mouth. Takes 2 tablets twice daily     . lisinopril (PRINIVIL,ZESTRIL) 40 MG tablet Take 40 mg by mouth daily.     . traZODone (DESYREL) 50 MG tablet Take 50 mg by mouth at bedtime.       No results found for this or any previous visit (from the past 48 hour(s)).  No results found.   A comprehensive review of systems was negative.  Blood pressure (!) 171/68, pulse 76, temperature 98.8 F (37.1 C), temperature source Oral, resp. rate 18, height 5\' 4"  (1.626 m), weight 66.7 kg, SpO2 96 %.  General appearance: alert, cooperative and appears stated age Head: Normocephalic, without obvious abnormality, atraumatic Neck: supple, symmetrical, trachea midline Cardio: regular rate and rhythm Resp: clear to auscultation bilaterally Extremities: decreased sensation and intact capillary refill all digits.  +epl/fpl/io.  No wounds.  Pulses: 2+ and symmetric Skin: Skin color, texture,  turgor normal. No rashes or lesions Neurologic: Grossly normal Incision/Wound: none  Assessment/Plan Left carpal tunnel syndrome.  Non operative and operative treatment options have been discussed with the patient and patient wishes to proceed with operative treatment. Risks, benefits, and alternatives of surgery have been discussed and the patient agrees with the plan of care.   Leanora Cover 08/08/2018, 10:13 AM

## 2018-08-08 NOTE — Anesthesia Procedure Notes (Signed)
Procedure Name: MAC Performed by: Lesean Woolverton W, CRNA Pre-anesthesia Checklist: Patient identified, Timeout performed, Emergency Drugs available, Suction available and Patient being monitored Oxygen Delivery Method: Simple face mask       

## 2018-08-08 NOTE — Transfer of Care (Signed)
Immediate Anesthesia Transfer of Care Note  Patient: Mckenzie Warner  Procedure(s) Performed: LEFT CARPAL TUNNEL RELEASE (Left )  Patient Location: PACU  Anesthesia Type:MAC and Bier block  Level of Consciousness: awake, alert  and oriented  Airway & Oxygen Therapy: Patient Spontanous Breathing  Post-op Assessment: Report given to RN and Post -op Vital signs reviewed and stable  Post vital signs: Reviewed and stable  Last Vitals:  Vitals Value Taken Time  BP 150/67 08/08/2018 11:19 AM  Temp    Pulse 65 08/08/2018 11:21 AM  Resp 15 08/08/2018 11:21 AM  SpO2 98 % 08/08/2018 11:21 AM  Vitals shown include unvalidated device data.  Last Pain:  Vitals:   08/08/18 0824  TempSrc: Oral  PainSc: 3       Patients Stated Pain Goal: 3 (54/65/68 1275)  Complications: No apparent anesthesia complications

## 2018-08-08 NOTE — Op Note (Signed)
08/08/2018 [facility]                              OPERATIVE REPORT   PREOPERATIVE DIAGNOSIS:  Left carpal tunnel syndrome.  POSTOPERATIVE DIAGNOSIS:  Left carpal tunnel syndrome.  PROCEDURE:  Left carpal tunnel release.  SURGEON:  Leanora Cover, MD  ASSISTANT:  none.  ANESTHESIA: Bier block with sedation  IV FLUIDS:  Per anesthesia flow sheet.  ESTIMATED BLOOD LOSS:  Minimal.  COMPLICATIONS:  None.  SPECIMENS:  None.  TOURNIQUET TIME:    Total Tourniquet Time Documented: Forearm (Left) - 28 minutes Total: Forearm (Left) - 28 minutes   DISPOSITION:  Stable to PACU.  LOCATION: [facility]  INDICATIONS:  85 yo female with numbness in left hand.  Positive nerve conduction studies.  Nocturnal symptoms.  She wishes to have a carpal tunnel release for management of her symptoms.  Risks, benefits and alternatives of surgery were discussed including the risk of blood loss; infection; damage to nerves, vessels, tendons, ligaments, bone; failure of surgery; need for additional surgery; complications with wound healing; continued pain; recurrence of carpal tunnel syndrome; and damage to motor branch. She voiced understanding of these risks and elected to proceed.   OPERATIVE COURSE:  After being identified preoperatively by myself, the patient and I agreed upon the procedure and site of procedure.  The surgical site was marked.  The risks, benefits, and alternatives of the surgery were reviewed and she wished to proceed.  Surgical consent had been signed.  She was given IV Ancef as preoperative antibiotic prophylaxis.  She was transferred to the operating room and placed on the operating room table in supine position with the Left upper extremity on an armboard.  Bier block anesthesia was induced by the anesthesiologist.  Left upper extremity was prepped and draped in normal sterile orthopaedic fashion.  A surgical pause was performed between the surgeons,  anesthesia, and operating room staff, and all were in agreement as to the patient, procedure, and site of procedure.  Tourniquet at the proximal aspect of the forearm had been inflated for the Bier block  Incision was made over the transverse carpal ligament and carried into the subcutaneous tissues by spreading technique.  Bipolar electrocautery was used to obtain hemostasis.  The palmar fascia was sharply incised.  The transverse carpal ligament was identified and sharply incised.  It was incised distally first.  Care was taken to ensure complete decompression distally.  It was then incised proximally.  Scissors were used to split the distal aspect of the volar antebrachial fascia.  A finger was placed into the wound to ensure complete decompression, which was the case.  The nerve was examined.  It was flattened and hyperemic.  The motor branch was identified and was intact.  The wound was copiously irrigated with sterile saline.  It was then closed with 4-0 nylon in a horizontal mattress fashion.  It was injected with 0.25% plain Marcaine to aid in postoperative analgesia.  It was dressed with sterile Xeroform, 4x4s, an ABD, and wrapped with Kerlix and an Ace bandage.  Tourniquet was deflated at 28 minutes.  Fingertips were pink with brisk capillary refill after deflation of the tourniquet.  Operative drapes were broken down.  The patient was awoken from anesthesia safely.  She was transferred back to stretcher and taken to the PACU in stable condition.  I will see her back in the office  in 1 week for postoperative followup.  I will give her a prescription for Norco 5/325 1-2 tabs PO q6 hours prn pain, dispense # 20.    Leanora Cover, MD Electronically signed, 08/08/18

## 2018-08-08 NOTE — Discharge Instructions (Addendum)

## 2018-08-08 NOTE — Anesthesia Postprocedure Evaluation (Signed)
Anesthesia Post Note  Patient: Mckenzie Warner  Procedure(s) Performed: LEFT CARPAL TUNNEL RELEASE (Left )     Patient location during evaluation: PACU Anesthesia Type: Bier Block Level of consciousness: awake and alert Pain management: pain level controlled Vital Signs Assessment: post-procedure vital signs reviewed and stable Respiratory status: spontaneous breathing, nonlabored ventilation and respiratory function stable Cardiovascular status: blood pressure returned to baseline and stable Postop Assessment: no apparent nausea or vomiting Anesthetic complications: no    Last Vitals:  Vitals:   08/08/18 1145 08/08/18 1157  BP:  (!) 164/79  Pulse:  98  Resp:  16  Temp:  36.5 C  SpO2: 95% 96%    Last Pain:  Vitals:   08/08/18 1157  TempSrc:   PainSc: 0-No pain                 Lidia Collum

## 2018-08-08 NOTE — Anesthesia Preprocedure Evaluation (Addendum)
Anesthesia Evaluation  Patient identified by MRN, date of birth, ID band Patient awake    Reviewed: Allergy & Precautions, NPO status , Patient's Chart, lab work & pertinent test results  History of Anesthesia Complications Negative for: history of anesthetic complications  Airway Mallampati: II  TM Distance: >3 FB Neck ROM: Full    Dental no notable dental hx. (+) Teeth Intact   Pulmonary neg pulmonary ROS,    Pulmonary exam normal        Cardiovascular hypertension, Normal cardiovascular exam     Neuro/Psych negative neurological ROS  negative psych ROS   GI/Hepatic Neg liver ROS, GERD  ,  Endo/Other  negative endocrine ROS  Renal/GU negative Renal ROS  negative genitourinary   Musculoskeletal negative musculoskeletal ROS (+)   Abdominal   Peds  Hematology negative hematology ROS (+)   Anesthesia Other Findings   Reproductive/Obstetrics                            Anesthesia Physical Anesthesia Plan  ASA: II  Anesthesia Plan: Bier Block and Bier Block-LIDOCAINE ONLY   Post-op Pain Management:    Induction:   PONV Risk Score and Plan: 2 and Propofol infusion and Treatment may vary due to age or medical condition  Airway Management Planned: Nasal Cannula and Simple Face Mask  Additional Equipment: None  Intra-op Plan:   Post-operative Plan:   Informed Consent: I have reviewed the patients History and Physical, chart, labs and discussed the procedure including the risks, benefits and alternatives for the proposed anesthesia with the patient or authorized representative who has indicated his/her understanding and acceptance.       Plan Discussed with:   Anesthesia Plan Comments:        Anesthesia Quick Evaluation

## 2018-08-09 ENCOUNTER — Encounter (HOSPITAL_BASED_OUTPATIENT_CLINIC_OR_DEPARTMENT_OTHER): Payer: Self-pay | Admitting: Orthopedic Surgery

## 2018-12-04 DIAGNOSIS — M810 Age-related osteoporosis without current pathological fracture: Secondary | ICD-10-CM | POA: Diagnosis not present

## 2018-12-04 DIAGNOSIS — I10 Essential (primary) hypertension: Secondary | ICD-10-CM | POA: Diagnosis not present

## 2018-12-04 DIAGNOSIS — E7800 Pure hypercholesterolemia, unspecified: Secondary | ICD-10-CM | POA: Diagnosis not present

## 2018-12-05 DIAGNOSIS — E7800 Pure hypercholesterolemia, unspecified: Secondary | ICD-10-CM | POA: Diagnosis not present

## 2018-12-05 DIAGNOSIS — M810 Age-related osteoporosis without current pathological fracture: Secondary | ICD-10-CM | POA: Diagnosis not present

## 2018-12-05 DIAGNOSIS — I10 Essential (primary) hypertension: Secondary | ICD-10-CM | POA: Diagnosis not present

## 2018-12-23 DIAGNOSIS — I10 Essential (primary) hypertension: Secondary | ICD-10-CM | POA: Diagnosis not present

## 2019-01-13 DIAGNOSIS — H02886 Meibomian gland dysfunction of left eye, unspecified eyelid: Secondary | ICD-10-CM | POA: Diagnosis not present

## 2019-01-13 DIAGNOSIS — H26492 Other secondary cataract, left eye: Secondary | ICD-10-CM | POA: Diagnosis not present

## 2019-01-13 DIAGNOSIS — H02883 Meibomian gland dysfunction of right eye, unspecified eyelid: Secondary | ICD-10-CM | POA: Diagnosis not present

## 2019-01-13 DIAGNOSIS — Z961 Presence of intraocular lens: Secondary | ICD-10-CM | POA: Diagnosis not present

## 2019-01-27 DIAGNOSIS — E7800 Pure hypercholesterolemia, unspecified: Secondary | ICD-10-CM | POA: Diagnosis not present

## 2019-01-27 DIAGNOSIS — M810 Age-related osteoporosis without current pathological fracture: Secondary | ICD-10-CM | POA: Diagnosis not present

## 2019-01-27 DIAGNOSIS — I10 Essential (primary) hypertension: Secondary | ICD-10-CM | POA: Diagnosis not present

## 2019-02-10 DIAGNOSIS — Z23 Encounter for immunization: Secondary | ICD-10-CM | POA: Diagnosis not present

## 2019-03-06 DIAGNOSIS — E7800 Pure hypercholesterolemia, unspecified: Secondary | ICD-10-CM | POA: Diagnosis not present

## 2019-03-06 DIAGNOSIS — I10 Essential (primary) hypertension: Secondary | ICD-10-CM | POA: Diagnosis not present

## 2019-03-06 DIAGNOSIS — M810 Age-related osteoporosis without current pathological fracture: Secondary | ICD-10-CM | POA: Diagnosis not present

## 2019-03-18 ENCOUNTER — Other Ambulatory Visit: Payer: Self-pay | Admitting: Internal Medicine

## 2019-03-18 DIAGNOSIS — Z1389 Encounter for screening for other disorder: Secondary | ICD-10-CM | POA: Diagnosis not present

## 2019-03-18 DIAGNOSIS — M810 Age-related osteoporosis without current pathological fracture: Secondary | ICD-10-CM

## 2019-03-18 DIAGNOSIS — I10 Essential (primary) hypertension: Secondary | ICD-10-CM | POA: Diagnosis not present

## 2019-03-18 DIAGNOSIS — Z0000 Encounter for general adult medical examination without abnormal findings: Secondary | ICD-10-CM | POA: Diagnosis not present

## 2019-03-18 DIAGNOSIS — E7800 Pure hypercholesterolemia, unspecified: Secondary | ICD-10-CM | POA: Diagnosis not present

## 2019-06-11 ENCOUNTER — Ambulatory Visit
Admission: RE | Admit: 2019-06-11 | Discharge: 2019-06-11 | Disposition: A | Discharge status: Home / Self Care | Payer: Medicare Other | Source: Ambulatory Visit | Attending: Internal Medicine | Admitting: Internal Medicine

## 2019-06-11 ENCOUNTER — Other Ambulatory Visit: Payer: Self-pay

## 2019-06-11 DIAGNOSIS — Z780 Asymptomatic menopausal state: Secondary | ICD-10-CM | POA: Diagnosis not present

## 2019-06-11 DIAGNOSIS — M810 Age-related osteoporosis without current pathological fracture: Secondary | ICD-10-CM

## 2019-06-11 DIAGNOSIS — M85851 Other specified disorders of bone density and structure, right thigh: Secondary | ICD-10-CM | POA: Diagnosis not present

## 2019-08-04 DIAGNOSIS — I10 Essential (primary) hypertension: Secondary | ICD-10-CM | POA: Diagnosis not present

## 2019-08-04 DIAGNOSIS — G4700 Insomnia, unspecified: Secondary | ICD-10-CM | POA: Diagnosis not present

## 2019-08-04 DIAGNOSIS — E7800 Pure hypercholesterolemia, unspecified: Secondary | ICD-10-CM | POA: Diagnosis not present

## 2019-08-04 DIAGNOSIS — M810 Age-related osteoporosis without current pathological fracture: Secondary | ICD-10-CM | POA: Diagnosis not present

## 2019-09-05 DIAGNOSIS — M810 Age-related osteoporosis without current pathological fracture: Secondary | ICD-10-CM | POA: Diagnosis not present

## 2019-09-05 DIAGNOSIS — G4700 Insomnia, unspecified: Secondary | ICD-10-CM | POA: Diagnosis not present

## 2019-09-05 DIAGNOSIS — E7800 Pure hypercholesterolemia, unspecified: Secondary | ICD-10-CM | POA: Diagnosis not present

## 2019-09-05 DIAGNOSIS — I10 Essential (primary) hypertension: Secondary | ICD-10-CM | POA: Diagnosis not present

## 2019-09-17 DIAGNOSIS — E7800 Pure hypercholesterolemia, unspecified: Secondary | ICD-10-CM | POA: Diagnosis not present

## 2019-09-17 DIAGNOSIS — M810 Age-related osteoporosis without current pathological fracture: Secondary | ICD-10-CM | POA: Diagnosis not present

## 2019-09-17 DIAGNOSIS — G4700 Insomnia, unspecified: Secondary | ICD-10-CM | POA: Diagnosis not present

## 2019-09-17 DIAGNOSIS — I10 Essential (primary) hypertension: Secondary | ICD-10-CM | POA: Diagnosis not present

## 2019-11-03 DIAGNOSIS — R252 Cramp and spasm: Secondary | ICD-10-CM | POA: Diagnosis not present

## 2019-11-03 DIAGNOSIS — Z8249 Family history of ischemic heart disease and other diseases of the circulatory system: Secondary | ICD-10-CM | POA: Diagnosis not present

## 2019-11-04 ENCOUNTER — Ambulatory Visit (HOSPITAL_COMMUNITY)
Admission: RE | Admit: 2019-11-04 | Discharge: 2019-11-04 | Disposition: A | Discharge status: Home / Self Care | Payer: Medicare Other | Source: Ambulatory Visit | Attending: Internal Medicine | Admitting: Internal Medicine

## 2019-11-04 ENCOUNTER — Other Ambulatory Visit (HOSPITAL_COMMUNITY): Payer: Self-pay | Admitting: Internal Medicine

## 2019-11-04 ENCOUNTER — Other Ambulatory Visit: Payer: Self-pay

## 2019-11-04 DIAGNOSIS — M79605 Pain in left leg: Secondary | ICD-10-CM | POA: Diagnosis not present

## 2019-11-04 DIAGNOSIS — M7989 Other specified soft tissue disorders: Secondary | ICD-10-CM | POA: Insufficient documentation

## 2019-11-04 NOTE — Progress Notes (Signed)
Left lower extremity venous duplex has been completed. Preliminary results can be found in CV Proc through chart review.  Results were given to [facility] at Dr. Quentin Cornwall office.  11/04/19 11:26 AM Mckenzie Warner RVT

## 2019-11-26 DIAGNOSIS — I10 Essential (primary) hypertension: Secondary | ICD-10-CM | POA: Diagnosis not present

## 2019-11-26 DIAGNOSIS — G4700 Insomnia, unspecified: Secondary | ICD-10-CM | POA: Diagnosis not present

## 2019-11-26 DIAGNOSIS — M810 Age-related osteoporosis without current pathological fracture: Secondary | ICD-10-CM | POA: Diagnosis not present

## 2019-11-26 DIAGNOSIS — E7800 Pure hypercholesterolemia, unspecified: Secondary | ICD-10-CM | POA: Diagnosis not present

## 2020-01-05 DIAGNOSIS — G4700 Insomnia, unspecified: Secondary | ICD-10-CM | POA: Diagnosis not present

## 2020-01-05 DIAGNOSIS — E7800 Pure hypercholesterolemia, unspecified: Secondary | ICD-10-CM | POA: Diagnosis not present

## 2020-01-05 DIAGNOSIS — M810 Age-related osteoporosis without current pathological fracture: Secondary | ICD-10-CM | POA: Diagnosis not present

## 2020-01-05 DIAGNOSIS — I10 Essential (primary) hypertension: Secondary | ICD-10-CM | POA: Diagnosis not present

## 2020-01-16 DIAGNOSIS — Z23 Encounter for immunization: Secondary | ICD-10-CM | POA: Diagnosis not present

## 2020-01-19 DIAGNOSIS — H02883 Meibomian gland dysfunction of right eye, unspecified eyelid: Secondary | ICD-10-CM | POA: Diagnosis not present

## 2020-01-19 DIAGNOSIS — Z961 Presence of intraocular lens: Secondary | ICD-10-CM | POA: Diagnosis not present

## 2020-01-19 DIAGNOSIS — H26492 Other secondary cataract, left eye: Secondary | ICD-10-CM | POA: Diagnosis not present

## 2020-01-19 DIAGNOSIS — H02886 Meibomian gland dysfunction of left eye, unspecified eyelid: Secondary | ICD-10-CM | POA: Diagnosis not present

## 2020-02-04 DIAGNOSIS — G4700 Insomnia, unspecified: Secondary | ICD-10-CM | POA: Diagnosis not present

## 2020-02-04 DIAGNOSIS — I10 Essential (primary) hypertension: Secondary | ICD-10-CM | POA: Diagnosis not present

## 2020-02-04 DIAGNOSIS — M810 Age-related osteoporosis without current pathological fracture: Secondary | ICD-10-CM | POA: Diagnosis not present

## 2020-02-04 DIAGNOSIS — E7800 Pure hypercholesterolemia, unspecified: Secondary | ICD-10-CM | POA: Diagnosis not present

## 2020-03-29 DIAGNOSIS — M810 Age-related osteoporosis without current pathological fracture: Secondary | ICD-10-CM | POA: Diagnosis not present

## 2020-03-29 DIAGNOSIS — Z23 Encounter for immunization: Secondary | ICD-10-CM | POA: Diagnosis not present

## 2020-03-29 DIAGNOSIS — Z0000 Encounter for general adult medical examination without abnormal findings: Secondary | ICD-10-CM | POA: Diagnosis not present

## 2020-03-29 DIAGNOSIS — I10 Essential (primary) hypertension: Secondary | ICD-10-CM | POA: Diagnosis not present

## 2020-03-29 DIAGNOSIS — E7800 Pure hypercholesterolemia, unspecified: Secondary | ICD-10-CM | POA: Diagnosis not present

## 2020-03-29 DIAGNOSIS — Z1389 Encounter for screening for other disorder: Secondary | ICD-10-CM | POA: Diagnosis not present

## 2020-04-12 DIAGNOSIS — G4700 Insomnia, unspecified: Secondary | ICD-10-CM | POA: Diagnosis not present

## 2020-04-12 DIAGNOSIS — E7800 Pure hypercholesterolemia, unspecified: Secondary | ICD-10-CM | POA: Diagnosis not present

## 2020-04-12 DIAGNOSIS — M810 Age-related osteoporosis without current pathological fracture: Secondary | ICD-10-CM | POA: Diagnosis not present

## 2020-04-12 DIAGNOSIS — I10 Essential (primary) hypertension: Secondary | ICD-10-CM | POA: Diagnosis not present

## 2020-06-15 DIAGNOSIS — M810 Age-related osteoporosis without current pathological fracture: Secondary | ICD-10-CM | POA: Diagnosis not present

## 2020-06-15 DIAGNOSIS — G4700 Insomnia, unspecified: Secondary | ICD-10-CM | POA: Diagnosis not present

## 2020-06-15 DIAGNOSIS — I10 Essential (primary) hypertension: Secondary | ICD-10-CM | POA: Diagnosis not present

## 2020-06-15 DIAGNOSIS — E7800 Pure hypercholesterolemia, unspecified: Secondary | ICD-10-CM | POA: Diagnosis not present

## 2020-06-25 DIAGNOSIS — M79661 Pain in right lower leg: Secondary | ICD-10-CM | POA: Diagnosis not present

## 2020-08-07 DIAGNOSIS — E7800 Pure hypercholesterolemia, unspecified: Secondary | ICD-10-CM | POA: Diagnosis not present

## 2020-08-07 DIAGNOSIS — I10 Essential (primary) hypertension: Secondary | ICD-10-CM | POA: Diagnosis not present

## 2020-08-07 DIAGNOSIS — G4700 Insomnia, unspecified: Secondary | ICD-10-CM | POA: Diagnosis not present

## 2020-08-07 DIAGNOSIS — M810 Age-related osteoporosis without current pathological fracture: Secondary | ICD-10-CM | POA: Diagnosis not present

## 2020-08-29 DIAGNOSIS — Z23 Encounter for immunization: Secondary | ICD-10-CM | POA: Diagnosis not present

## 2020-09-22 DIAGNOSIS — M810 Age-related osteoporosis without current pathological fracture: Secondary | ICD-10-CM | POA: Diagnosis not present

## 2020-09-22 DIAGNOSIS — E7800 Pure hypercholesterolemia, unspecified: Secondary | ICD-10-CM | POA: Diagnosis not present

## 2020-09-22 DIAGNOSIS — G4700 Insomnia, unspecified: Secondary | ICD-10-CM | POA: Diagnosis not present

## 2020-09-22 DIAGNOSIS — I10 Essential (primary) hypertension: Secondary | ICD-10-CM | POA: Diagnosis not present

## 2020-09-27 DIAGNOSIS — M810 Age-related osteoporosis without current pathological fracture: Secondary | ICD-10-CM | POA: Diagnosis not present

## 2020-09-27 DIAGNOSIS — E7800 Pure hypercholesterolemia, unspecified: Secondary | ICD-10-CM | POA: Diagnosis not present

## 2020-09-27 DIAGNOSIS — I10 Essential (primary) hypertension: Secondary | ICD-10-CM | POA: Diagnosis not present

## 2020-11-01 DIAGNOSIS — M810 Age-related osteoporosis without current pathological fracture: Secondary | ICD-10-CM | POA: Diagnosis not present

## 2020-11-01 DIAGNOSIS — I10 Essential (primary) hypertension: Secondary | ICD-10-CM | POA: Diagnosis not present

## 2020-11-01 DIAGNOSIS — G4700 Insomnia, unspecified: Secondary | ICD-10-CM | POA: Diagnosis not present

## 2020-11-01 DIAGNOSIS — E7800 Pure hypercholesterolemia, unspecified: Secondary | ICD-10-CM | POA: Diagnosis not present

## 2021-01-24 DIAGNOSIS — H02886 Meibomian gland dysfunction of left eye, unspecified eyelid: Secondary | ICD-10-CM | POA: Diagnosis not present

## 2021-01-24 DIAGNOSIS — H02883 Meibomian gland dysfunction of right eye, unspecified eyelid: Secondary | ICD-10-CM | POA: Diagnosis not present

## 2021-01-24 DIAGNOSIS — I10 Essential (primary) hypertension: Secondary | ICD-10-CM | POA: Diagnosis not present

## 2021-01-24 DIAGNOSIS — H26492 Other secondary cataract, left eye: Secondary | ICD-10-CM | POA: Diagnosis not present

## 2021-02-09 DIAGNOSIS — S20461A Insect bite (nonvenomous) of right back wall of thorax, initial encounter: Secondary | ICD-10-CM | POA: Diagnosis not present

## 2021-02-09 DIAGNOSIS — W57XXXA Bitten or stung by nonvenomous insect and other nonvenomous arthropods, initial encounter: Secondary | ICD-10-CM | POA: Diagnosis not present

## 2021-02-09 DIAGNOSIS — L299 Pruritus, unspecified: Secondary | ICD-10-CM | POA: Diagnosis not present

## 2021-02-18 DIAGNOSIS — L298 Other pruritus: Secondary | ICD-10-CM | POA: Diagnosis not present

## 2021-02-24 DIAGNOSIS — M810 Age-related osteoporosis without current pathological fracture: Secondary | ICD-10-CM | POA: Diagnosis not present

## 2021-02-24 DIAGNOSIS — I10 Essential (primary) hypertension: Secondary | ICD-10-CM | POA: Diagnosis not present

## 2021-02-24 DIAGNOSIS — E7800 Pure hypercholesterolemia, unspecified: Secondary | ICD-10-CM | POA: Diagnosis not present

## 2021-02-24 DIAGNOSIS — G4700 Insomnia, unspecified: Secondary | ICD-10-CM | POA: Diagnosis not present

## 2021-04-04 DIAGNOSIS — G4700 Insomnia, unspecified: Secondary | ICD-10-CM | POA: Diagnosis not present

## 2021-04-04 DIAGNOSIS — I10 Essential (primary) hypertension: Secondary | ICD-10-CM | POA: Diagnosis not present

## 2021-04-04 DIAGNOSIS — Z1389 Encounter for screening for other disorder: Secondary | ICD-10-CM | POA: Diagnosis not present

## 2021-04-04 DIAGNOSIS — E7800 Pure hypercholesterolemia, unspecified: Secondary | ICD-10-CM | POA: Diagnosis not present

## 2021-04-04 DIAGNOSIS — Z23 Encounter for immunization: Secondary | ICD-10-CM | POA: Diagnosis not present

## 2021-04-04 DIAGNOSIS — Z0000 Encounter for general adult medical examination without abnormal findings: Secondary | ICD-10-CM | POA: Diagnosis not present

## 2021-04-04 DIAGNOSIS — M810 Age-related osteoporosis without current pathological fracture: Secondary | ICD-10-CM | POA: Diagnosis not present

## 2021-04-12 ENCOUNTER — Other Ambulatory Visit: Payer: Self-pay | Admitting: Internal Medicine

## 2021-04-12 DIAGNOSIS — M810 Age-related osteoporosis without current pathological fracture: Secondary | ICD-10-CM

## 2021-10-12 ENCOUNTER — Ambulatory Visit
Admission: RE | Admit: 2021-10-12 | Discharge: 2021-10-12 | Disposition: A | Discharge status: Home / Self Care | Payer: Medicare Other | Source: Ambulatory Visit | Attending: Internal Medicine | Admitting: Internal Medicine

## 2021-10-12 DIAGNOSIS — Z780 Asymptomatic menopausal state: Secondary | ICD-10-CM | POA: Diagnosis not present

## 2021-10-12 DIAGNOSIS — M810 Age-related osteoporosis without current pathological fracture: Secondary | ICD-10-CM

## 2021-10-12 DIAGNOSIS — M8588 Other specified disorders of bone density and structure, other site: Secondary | ICD-10-CM | POA: Diagnosis not present

## 2022-02-08 DIAGNOSIS — L821 Other seborrheic keratosis: Secondary | ICD-10-CM | POA: Diagnosis not present

## 2022-02-08 DIAGNOSIS — L820 Inflamed seborrheic keratosis: Secondary | ICD-10-CM | POA: Diagnosis not present

## 2022-03-13 DIAGNOSIS — I10 Essential (primary) hypertension: Secondary | ICD-10-CM | POA: Diagnosis not present

## 2022-03-13 DIAGNOSIS — H02886 Meibomian gland dysfunction of left eye, unspecified eyelid: Secondary | ICD-10-CM | POA: Diagnosis not present

## 2022-03-13 DIAGNOSIS — H02883 Meibomian gland dysfunction of right eye, unspecified eyelid: Secondary | ICD-10-CM | POA: Diagnosis not present

## 2022-03-13 DIAGNOSIS — Z961 Presence of intraocular lens: Secondary | ICD-10-CM | POA: Diagnosis not present

## 2022-04-07 DIAGNOSIS — Z23 Encounter for immunization: Secondary | ICD-10-CM | POA: Diagnosis not present

## 2022-04-07 DIAGNOSIS — N952 Postmenopausal atrophic vaginitis: Secondary | ICD-10-CM | POA: Diagnosis not present

## 2022-04-07 DIAGNOSIS — E7800 Pure hypercholesterolemia, unspecified: Secondary | ICD-10-CM | POA: Diagnosis not present

## 2022-04-07 DIAGNOSIS — M810 Age-related osteoporosis without current pathological fracture: Secondary | ICD-10-CM | POA: Diagnosis not present

## 2022-04-07 DIAGNOSIS — N763 Subacute and chronic vulvitis: Secondary | ICD-10-CM | POA: Diagnosis not present

## 2022-04-07 DIAGNOSIS — N8110 Cystocele, unspecified: Secondary | ICD-10-CM | POA: Diagnosis not present

## 2022-04-07 DIAGNOSIS — I10 Essential (primary) hypertension: Secondary | ICD-10-CM | POA: Diagnosis not present

## 2022-04-07 DIAGNOSIS — Z0000 Encounter for general adult medical examination without abnormal findings: Secondary | ICD-10-CM | POA: Diagnosis not present

## 2022-04-07 DIAGNOSIS — Z1331 Encounter for screening for depression: Secondary | ICD-10-CM | POA: Diagnosis not present

## 2022-09-28 DIAGNOSIS — L820 Inflamed seborrheic keratosis: Secondary | ICD-10-CM | POA: Diagnosis not present

## 2022-09-28 DIAGNOSIS — L308 Other specified dermatitis: Secondary | ICD-10-CM | POA: Diagnosis not present

## 2022-09-28 DIAGNOSIS — D485 Neoplasm of uncertain behavior of skin: Secondary | ICD-10-CM | POA: Diagnosis not present

## 2022-09-28 DIAGNOSIS — L821 Other seborrheic keratosis: Secondary | ICD-10-CM | POA: Diagnosis not present

## 2022-09-28 DIAGNOSIS — D489 Neoplasm of uncertain behavior, unspecified: Secondary | ICD-10-CM | POA: Diagnosis not present

## 2022-10-09 DIAGNOSIS — I10 Essential (primary) hypertension: Secondary | ICD-10-CM | POA: Diagnosis not present

## 2022-10-09 DIAGNOSIS — E7800 Pure hypercholesterolemia, unspecified: Secondary | ICD-10-CM | POA: Diagnosis not present

## 2022-10-09 DIAGNOSIS — M810 Age-related osteoporosis without current pathological fracture: Secondary | ICD-10-CM | POA: Diagnosis not present

## 2022-10-09 DIAGNOSIS — R202 Paresthesia of skin: Secondary | ICD-10-CM | POA: Diagnosis not present

## 2023-03-16 DIAGNOSIS — Z961 Presence of intraocular lens: Secondary | ICD-10-CM | POA: Diagnosis not present

## 2023-03-16 DIAGNOSIS — H02883 Meibomian gland dysfunction of right eye, unspecified eyelid: Secondary | ICD-10-CM | POA: Diagnosis not present

## 2023-03-16 DIAGNOSIS — H02886 Meibomian gland dysfunction of left eye, unspecified eyelid: Secondary | ICD-10-CM | POA: Diagnosis not present

## 2023-03-16 DIAGNOSIS — I10 Essential (primary) hypertension: Secondary | ICD-10-CM | POA: Diagnosis not present

## 2023-04-16 DIAGNOSIS — M810 Age-related osteoporosis without current pathological fracture: Secondary | ICD-10-CM | POA: Diagnosis not present

## 2023-04-16 DIAGNOSIS — I10 Essential (primary) hypertension: Secondary | ICD-10-CM | POA: Diagnosis not present

## 2023-04-16 DIAGNOSIS — Z1331 Encounter for screening for depression: Secondary | ICD-10-CM | POA: Diagnosis not present

## 2023-04-16 DIAGNOSIS — Z23 Encounter for immunization: Secondary | ICD-10-CM | POA: Diagnosis not present

## 2023-04-16 DIAGNOSIS — Z0000 Encounter for general adult medical examination without abnormal findings: Secondary | ICD-10-CM | POA: Diagnosis not present

## 2023-04-16 DIAGNOSIS — E7800 Pure hypercholesterolemia, unspecified: Secondary | ICD-10-CM | POA: Diagnosis not present

## 2023-04-17 ENCOUNTER — Other Ambulatory Visit: Payer: Self-pay | Admitting: Internal Medicine

## 2023-04-17 DIAGNOSIS — M810 Age-related osteoporosis without current pathological fracture: Secondary | ICD-10-CM

## 2023-04-25 DIAGNOSIS — Z23 Encounter for immunization: Secondary | ICD-10-CM | POA: Diagnosis not present

## 2023-09-20 DIAGNOSIS — D485 Neoplasm of uncertain behavior of skin: Secondary | ICD-10-CM | POA: Diagnosis not present

## 2023-09-20 DIAGNOSIS — D487 Neoplasm of uncertain behavior of other specified sites: Secondary | ICD-10-CM | POA: Diagnosis not present

## 2023-09-28 DIAGNOSIS — S41111A Laceration without foreign body of right upper arm, initial encounter: Secondary | ICD-10-CM | POA: Diagnosis not present

## 2023-10-15 DIAGNOSIS — E7800 Pure hypercholesterolemia, unspecified: Secondary | ICD-10-CM | POA: Diagnosis not present

## 2023-10-15 DIAGNOSIS — I10 Essential (primary) hypertension: Secondary | ICD-10-CM | POA: Diagnosis not present

## 2023-10-15 DIAGNOSIS — M810 Age-related osteoporosis without current pathological fracture: Secondary | ICD-10-CM | POA: Diagnosis not present

## 2023-10-15 DIAGNOSIS — K219 Gastro-esophageal reflux disease without esophagitis: Secondary | ICD-10-CM | POA: Diagnosis not present

## 2023-10-29 DIAGNOSIS — I10 Essential (primary) hypertension: Secondary | ICD-10-CM | POA: Diagnosis not present

## 2023-10-29 DIAGNOSIS — E7800 Pure hypercholesterolemia, unspecified: Secondary | ICD-10-CM | POA: Diagnosis not present

## 2023-11-13 DIAGNOSIS — I10 Essential (primary) hypertension: Secondary | ICD-10-CM | POA: Diagnosis not present

## 2023-11-14 DIAGNOSIS — L430 Hypertrophic lichen planus: Secondary | ICD-10-CM | POA: Diagnosis not present

## 2023-11-14 DIAGNOSIS — L2989 Other pruritus: Secondary | ICD-10-CM | POA: Diagnosis not present

## 2023-11-14 DIAGNOSIS — L821 Other seborrheic keratosis: Secondary | ICD-10-CM | POA: Diagnosis not present

## 2024-01-16 DIAGNOSIS — J189 Pneumonia, unspecified organism: Secondary | ICD-10-CM | POA: Diagnosis not present

## 2024-01-16 DIAGNOSIS — J988 Other specified respiratory disorders: Secondary | ICD-10-CM | POA: Diagnosis not present

## 2024-01-16 DIAGNOSIS — R051 Acute cough: Secondary | ICD-10-CM | POA: Diagnosis not present

## 2024-01-16 DIAGNOSIS — Z03818 Encounter for observation for suspected exposure to other biological agents ruled out: Secondary | ICD-10-CM | POA: Diagnosis not present

## 2024-03-07 DIAGNOSIS — Z23 Encounter for immunization: Secondary | ICD-10-CM | POA: Diagnosis not present
